# Patient Record
Sex: Female | Born: 1990 | Race: Black or African American | Hispanic: No | Marital: Single | Smoking: Never smoker
Health system: Southern US, Community
[De-identification: ages and names within clinical notes are randomized; demographics above are authoritative.]

## PROBLEM LIST (undated history)

## (undated) DIAGNOSIS — J45909 Unspecified asthma, uncomplicated: Secondary | ICD-10-CM

---

## 2012-12-28 ENCOUNTER — Emergency Department (HOSPITAL_COMMUNITY)
Admission: EM | Admit: 2012-12-28 | Discharge: 2012-12-28 | Disposition: A | Discharge status: Home / Self Care | Payer: No Typology Code available for payment source | Attending: Emergency Medicine | Admitting: Emergency Medicine

## 2012-12-28 ENCOUNTER — Encounter (HOSPITAL_COMMUNITY): Payer: Self-pay | Admitting: *Deleted

## 2012-12-28 ENCOUNTER — Emergency Department (HOSPITAL_COMMUNITY): Payer: No Typology Code available for payment source

## 2012-12-28 DIAGNOSIS — M2550 Pain in unspecified joint: Secondary | ICD-10-CM | POA: Insufficient documentation

## 2012-12-28 DIAGNOSIS — Y9389 Activity, other specified: Secondary | ICD-10-CM | POA: Insufficient documentation

## 2012-12-28 DIAGNOSIS — R209 Unspecified disturbances of skin sensation: Secondary | ICD-10-CM | POA: Insufficient documentation

## 2012-12-28 DIAGNOSIS — S0993XA Unspecified injury of face, initial encounter: Secondary | ICD-10-CM | POA: Insufficient documentation

## 2012-12-28 DIAGNOSIS — Y92410 Unspecified street and highway as the place of occurrence of the external cause: Secondary | ICD-10-CM | POA: Insufficient documentation

## 2012-12-28 DIAGNOSIS — J45909 Unspecified asthma, uncomplicated: Secondary | ICD-10-CM | POA: Insufficient documentation

## 2012-12-28 HISTORY — DX: Unspecified asthma, uncomplicated: J45.909

## 2012-12-28 MED ORDER — IBUPROFEN 600 MG PO TABS: 600.0000 mg | ORAL_TABLET | Freq: Four times a day (QID) | ORAL | Status: DC | PRN

## 2012-12-28 MED ORDER — HYDROCODONE-ACETAMINOPHEN 5-325 MG PO TABS: 1.0000 | ORAL_TABLET | ORAL | Status: DC | PRN

## 2012-12-28 MED ORDER — ACETAMINOPHEN 325 MG PO TABS
650.0000 mg | ORAL_TABLET | Freq: Once | ORAL | Status: AC
  Administered 2012-12-28: 650 mg via ORAL
  Filled 2012-12-28 (×2): qty 2

## 2012-12-28 MED ORDER — ACETAMINOPHEN 325 MG PO TABS: 650.0000 mg | ORAL_TABLET | Freq: Once | ORAL | Status: DC

## 2012-12-28 MED ORDER — CYCLOBENZAPRINE HCL 5 MG PO TABS: 10.0000 mg | ORAL_TABLET | Freq: Two times a day (BID) | ORAL | Status: DC | PRN
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
## 2013-01-04 NOTE — ED Provider Notes (Signed)
Medical screening examination/treatment/procedure(s) were performed by non-physician practitioner and as supervising physician I was immediately available for consultation/collaboration.  Hurman Horn, MD 01/04/13 [phone]

## 2013-03-11 ENCOUNTER — Encounter (HOSPITAL_COMMUNITY): Payer: Self-pay | Admitting: *Deleted

## 2013-03-11 ENCOUNTER — Emergency Department (HOSPITAL_COMMUNITY)
Admission: EM | Admit: 2013-03-11 | Discharge: 2013-03-11 | Disposition: A | Discharge status: Home / Self Care | Payer: Self-pay | Attending: Emergency Medicine | Admitting: Emergency Medicine

## 2013-03-11 DIAGNOSIS — J45909 Unspecified asthma, uncomplicated: Secondary | ICD-10-CM | POA: Insufficient documentation

## 2013-03-11 DIAGNOSIS — J3489 Other specified disorders of nose and nasal sinuses: Secondary | ICD-10-CM | POA: Insufficient documentation

## 2013-03-11 DIAGNOSIS — R0981 Nasal congestion: Secondary | ICD-10-CM

## 2013-03-11 MED ORDER — CETIRIZINE-PSEUDOEPHEDRINE ER 5-120 MG PO TB12: 1.0000 | ORAL_TABLET | Freq: Two times a day (BID) | ORAL | Status: AC | PRN

## 2013-03-11 MED ORDER — HYDROCODONE-ACETAMINOPHEN 5-325 MG PO TABS: 1.0000 | ORAL_TABLET | Freq: Four times a day (QID) | ORAL | Status: AC | PRN
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
## 2013-03-11 NOTE — ED Notes (Signed)
Pt c/o frontal/sinus HA x 3 days, denies n/v, photophobia.
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
## 2013-12-20 ENCOUNTER — Emergency Department (HOSPITAL_COMMUNITY)
Admission: EM | Admit: 2013-12-20 | Discharge: 2013-12-20 | Discharge status: Against Medical Advice | Payer: No Typology Code available for payment source | Attending: Emergency Medicine | Admitting: Emergency Medicine

## 2013-12-20 ENCOUNTER — Encounter (HOSPITAL_COMMUNITY): Payer: Self-pay | Admitting: Emergency Medicine

## 2013-12-20 DIAGNOSIS — J45901 Unspecified asthma with (acute) exacerbation: Secondary | ICD-10-CM | POA: Insufficient documentation

## 2013-12-20 NOTE — ED Notes (Signed)
Called patient to get labs. Unable to locate patient at this time
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
## 2013-12-20 NOTE — ED Notes (Signed)
Called for answer. No answer. Pt has been called multiple times for blood draws and no answer. Pt will be discharged from ED

## 2013-12-20 NOTE — ED Notes (Addendum)
Presents with SOB and sharp sternal chest pain intermittently for the past 2-3 weeks associated with nausea and diarrhea. CP is made better with use of inhaler at times, other times does not help. Nothing makes pain worse. SAts 97% RA, bilateral breath sounds clear. Denies pain at this time.
